# Patient Record
Sex: Female | Born: 1975 | Hispanic: Yes | Smoking: Never smoker
Health system: Southern US, Community
[De-identification: ages and names within clinical notes are randomized; demographics above are authoritative.]

## PROBLEM LIST (undated history)

## (undated) DIAGNOSIS — K219 Gastro-esophageal reflux disease without esophagitis: Secondary | ICD-10-CM

## (undated) DIAGNOSIS — K760 Fatty (change of) liver, not elsewhere classified: Secondary | ICD-10-CM

## (undated) HISTORY — DX: Gastro-esophageal reflux disease without esophagitis: K21.9

## (undated) HISTORY — PX: OTHER SURGICAL HISTORY: SHX169

## (undated) HISTORY — PX: TUBAL LIGATION: SHX77

## (undated) HISTORY — DX: Fatty (change of) liver, not elsewhere classified: K76.0

---

## 2002-03-14 ENCOUNTER — Encounter: Payer: Self-pay | Admitting: General Surgery

## 2002-03-14 ENCOUNTER — Ambulatory Visit (HOSPITAL_COMMUNITY): Admission: RE | Admit: 2002-03-14 | Discharge: 2002-03-14 | Payer: Self-pay | Admitting: General Surgery

## 2003-06-18 ENCOUNTER — Inpatient Hospital Stay (HOSPITAL_COMMUNITY): Admission: AD | Admit: 2003-06-18 | Discharge: 2003-06-20 | Payer: Self-pay | Admitting: Obstetrics and Gynecology

## 2005-01-29 ENCOUNTER — Inpatient Hospital Stay (HOSPITAL_COMMUNITY): Admission: AD | Admit: 2005-01-29 | Discharge: 2005-02-01 | Payer: Self-pay | Admitting: Obstetrics and Gynecology

## 2006-05-18 ENCOUNTER — Inpatient Hospital Stay (HOSPITAL_COMMUNITY): Admission: AD | Admit: 2006-05-18 | Discharge: 2006-05-20 | Payer: Self-pay | Admitting: Obstetrics and Gynecology

## 2008-12-20 HISTORY — PX: HAND SURGERY: SHX662

## 2010-09-27 ENCOUNTER — Emergency Department (HOSPITAL_COMMUNITY): Admission: EM | Admit: 2010-09-27 | Discharge: 2010-09-27 | Payer: Self-pay | Admitting: Internal Medicine

## 2011-02-19 ENCOUNTER — Emergency Department (HOSPITAL_COMMUNITY): Payer: Self-pay

## 2011-02-19 ENCOUNTER — Emergency Department (HOSPITAL_COMMUNITY)
Admission: EM | Admit: 2011-02-19 | Discharge: 2011-02-19 | Disposition: A | Discharge status: Home / Self Care | Payer: Self-pay | Attending: Emergency Medicine | Admitting: Emergency Medicine

## 2011-02-19 DIAGNOSIS — R109 Unspecified abdominal pain: Secondary | ICD-10-CM | POA: Insufficient documentation

## 2011-02-19 DIAGNOSIS — K7689 Other specified diseases of liver: Secondary | ICD-10-CM | POA: Insufficient documentation

## 2011-02-19 LAB — CBC
HCT: 36.3 % (ref 36.0–46.0)
Hemoglobin: 12.3 g/dL (ref 12.0–15.0)
MCH: 28.3 pg (ref 26.0–34.0)
MCHC: 33.9 g/dL (ref 30.0–36.0)
MCV: 83.4 fL (ref 78.0–100.0)
Platelets: 300 10*3/uL (ref 150–400)
RBC: 4.35 MIL/uL (ref 3.87–5.11)
RDW: 13.4 % (ref 11.5–15.5)
WBC: 8.7 10*3/uL (ref 4.0–10.5)

## 2011-02-19 LAB — URINALYSIS, ROUTINE W REFLEX MICROSCOPIC
Bilirubin Urine: NEGATIVE
Glucose, UA: NEGATIVE mg/dL
Hgb urine dipstick: NEGATIVE
Ketones, ur: NEGATIVE mg/dL
Nitrite: NEGATIVE
Protein, ur: NEGATIVE mg/dL
Specific Gravity, Urine: >1.03 — ABNORMAL HIGH (ref 1.005–1.030)
Urobilinogen, UA: 0.2 mg/dL (ref 0.0–1.0)
pH: 5.5 (ref 5.0–8.0)

## 2011-02-19 LAB — COMPREHENSIVE METABOLIC PANEL
BUN: 10 mg/dL (ref 6–23)
Calcium: 8.9 mg/dL (ref 8.4–10.5)
Glucose, Bld: 83 mg/dL (ref 70–99)
Sodium: 137 mEq/L (ref 135–145)
Total Protein: 7.4 g/dL (ref 6.0–8.3)

## 2011-02-19 LAB — DIFFERENTIAL
Basophils Absolute: 0 10*3/uL (ref 0.0–0.1)
Basophils Relative: 0 % (ref 0–1)
Eosinophils Absolute: 0.1 10*3/uL (ref 0.0–0.7)
Eosinophils Relative: 1 % (ref 0–5)
Lymphocytes Relative: 14 % (ref 12–46)
Lymphs Abs: 1.2 10*3/uL (ref 0.7–4.0)
Monocytes Absolute: 0.4 10*3/uL (ref 0.1–1.0)
Monocytes Relative: 4 % (ref 3–12)
Neutro Abs: 7 10*3/uL (ref 1.7–7.7)
Neutrophils Relative %: 80 % — ABNORMAL HIGH (ref 43–77)

## 2011-02-19 LAB — PREGNANCY, URINE: Preg Test, Ur: NEGATIVE

## 2011-03-03 LAB — COMPREHENSIVE METABOLIC PANEL
Alkaline Phosphatase: 61 U/L (ref 39–117)
BUN: 8 mg/dL (ref 6–23)
CO2: 24 mEq/L (ref 19–32)
Chloride: 104 mEq/L (ref 96–112)
Glucose, Bld: 107 mg/dL — ABNORMAL HIGH (ref 70–99)
Potassium: 3.8 mEq/L (ref 3.5–5.1)
Total Bilirubin: 0.6 mg/dL (ref 0.3–1.2)

## 2011-03-03 LAB — URINALYSIS, ROUTINE W REFLEX MICROSCOPIC
Glucose, UA: NEGATIVE mg/dL
Ketones, ur: NEGATIVE mg/dL
Protein, ur: NEGATIVE mg/dL

## 2011-03-03 LAB — CBC
HCT: 37.7 % (ref 36.0–46.0)
MCV: 85.8 fL (ref 78.0–100.0)
RBC: 4.4 MIL/uL (ref 3.87–5.11)
WBC: 8 10*3/uL (ref 4.0–10.5)

## 2011-03-03 LAB — DIFFERENTIAL
Basophils Absolute: 0 10*3/uL (ref 0.0–0.1)
Basophils Relative: 0 % (ref 0–1)
Monocytes Absolute: 0.4 10*3/uL (ref 0.1–1.0)
Neutro Abs: 5.2 10*3/uL (ref 1.7–7.7)
Neutrophils Relative %: 66 % (ref 43–77)

## 2011-03-03 LAB — PREGNANCY, URINE: Preg Test, Ur: NEGATIVE

## 2011-05-07 NOTE — H&P (Signed)
   NAME:  Brianna Costa                         ACCOUNT NO.:  [id number]   MEDICAL RECORD NO.:  [id number]                   PATIENT TYPE:  OIB   LOCATION:  A414                                 FACILITY:  [facility]   PHYSICIAN:  Tilda Burrow, M.D.              DATE OF BIRTH:  [birth year]   DATE OF ADMISSION:  06/18/2003  DATE OF DISCHARGE:                                HISTORY & PHYSICAL   CHIEF COMPLAINT:  Contractions since 0900.   HISTORY OF PRESENT ILLNESS:  Brianna Costa is a 35 year old gravida 3, para 2 with  an EDC of June 22, 2003 based on a first and second trimester ultrasound,  placing her at 39 weeks and 3 days gestation. Prenatal course is  uncomplicated. Total weight gain has been 17 pounds with appropriate fundal  height growth. Blood pressures are 110s to 120s over 60s to 80s.   PRENATAL LABORATORY DATA:  Blood type B+, Rubella immune. HBsAg, HIV, GC,  Chlamydia, GBS and sickle are all negative.   PAST MEDICAL HISTORY:  Noncontributory.   PAST SURGICAL HISTORY:  Benign.   ALLERGIES:  No known drug allergies.   SOCIAL HISTORY:  Married housewife. Is a Jehovah's Witness.   GYNECOLOGY/OBSTETRIC HISTORY:  A vaginal delivery at approximately 36 weeks  in 1997 of a 5-pound, 2-ounce female in [place]. In 2001, a vaginal delivery at  38 weeks of a 7-pound, 12-ounce female at [facility] without problems.   FAMILY HISTORY:  Noncontributory.   PHYSICAL EXAMINATION:  HEENT:  Within normal limits.  HEART:  Regular rate and rhythm.  LUNGS:  Clear.  ABDOMEN:  Soft and nontender. Having moderate contractions about every 5  minutes. Cervix has progressed from 375 minus 1 to 690 and minus 1 over the  course of 1 and 1/2 hours. Fetal heart rate is 120s and 130s, reactive  without decelerations. Artificial rupture of membranes reveals clear fluid.  The patient is tolerating extremely well.  EXTREMITIES:  Legs are negative.    IMPRESSION:  Intrauterine pregnancy  at 67 and 1/[redacted] weeks  gestation, active  labor.   PLAN:  Expectant management.     Jacklyn Shell, C.N.M.          Tilda Burrow, M.D.    FC/MEDQ  D:  06/18/2003  T:  06/18/2003  Job:  [id number]   cc:   [facility]

## 2011-05-07 NOTE — Op Note (Signed)
   NAME:  Brianna Costa                         ACCOUNT NO.:  [id number]   MEDICAL RECORD NO.:  [id number]                   PATIENT TYPE:  INP   LOCATION:  LDR1                                 FACILITY:  [facility]   PHYSICIAN:  Tilda Burrow, M.D.              DATE OF BIRTH:  1976-10-27   DATE OF PROCEDURE:  DATE OF DISCHARGE:                                 OPERATIVE REPORT   DESCRIPTION OF PROCEDURE:  [name] developed an irresistible urge to push  when she was approximately 8 cm dilated and the cervix was reducible so she  was allowed to push. After a very brief second stage, she delivered a viable  female infant at 19:49. The mouth and nose were suctioned with a bulb syringe,  a nuchal cord was easily reduced and the body delivered without difficulty.  Weight of 7 pounds, 6 ounces, Apgar 9 & 9. The cord was doubly clamped and  cut by the father of the baby. The placenta separated spontaneously and was  delivered via controlled cord traction at 19:55. It was inspected and  appears to be intact with a three vessel cord. The fundus was massaged to  firm with little blood loss noted. 10 units of Pitocin was given IM  injection. The vagina was inspected and is intact. Estimated blood loss 300  mL.     Zenovia Jordan, P.A.                      Tilda Burrow, M.D.    RRK/MEDQ  D:  06/18/2003  T:  06/18/2003  Job:  [id number]

## 2011-05-21 ENCOUNTER — Emergency Department (HOSPITAL_COMMUNITY)
Admission: EM | Admit: 2011-05-21 | Discharge: 2011-05-21 | Disposition: A | Discharge status: Home / Self Care | Payer: No Typology Code available for payment source | Attending: Emergency Medicine | Admitting: Emergency Medicine

## 2011-05-21 ENCOUNTER — Emergency Department (HOSPITAL_COMMUNITY): Payer: No Typology Code available for payment source

## 2011-05-21 DIAGNOSIS — T148XXA Other injury of unspecified body region, initial encounter: Secondary | ICD-10-CM | POA: Insufficient documentation

## 2011-05-21 DIAGNOSIS — R7989 Other specified abnormal findings of blood chemistry: Secondary | ICD-10-CM | POA: Insufficient documentation

## 2011-05-21 DIAGNOSIS — Y998 Other external cause status: Secondary | ICD-10-CM | POA: Insufficient documentation

## 2011-05-21 DIAGNOSIS — M542 Cervicalgia: Secondary | ICD-10-CM | POA: Insufficient documentation

## 2011-05-21 DIAGNOSIS — R112 Nausea with vomiting, unspecified: Secondary | ICD-10-CM | POA: Insufficient documentation

## 2011-05-21 DIAGNOSIS — Y92410 Unspecified street and highway as the place of occurrence of the external cause: Secondary | ICD-10-CM | POA: Insufficient documentation

## 2011-05-21 DIAGNOSIS — M549 Dorsalgia, unspecified: Secondary | ICD-10-CM | POA: Insufficient documentation

## 2011-05-21 DIAGNOSIS — S301XXA Contusion of abdominal wall, initial encounter: Secondary | ICD-10-CM | POA: Insufficient documentation

## 2011-05-21 DIAGNOSIS — Z79899 Other long term (current) drug therapy: Secondary | ICD-10-CM | POA: Insufficient documentation

## 2011-05-21 DIAGNOSIS — K7689 Other specified diseases of liver: Secondary | ICD-10-CM | POA: Insufficient documentation

## 2011-05-21 DIAGNOSIS — R52 Pain, unspecified: Secondary | ICD-10-CM | POA: Insufficient documentation

## 2011-05-21 LAB — DIFFERENTIAL
Lymphocytes Relative: 24 % (ref 12–46)
Lymphs Abs: 2.2 10*3/uL (ref 0.7–4.0)
Monocytes Absolute: 0.5 10*3/uL (ref 0.1–1.0)
Monocytes Relative: 6 % (ref 3–12)
Neutro Abs: 6.2 10*3/uL (ref 1.7–7.7)

## 2011-05-21 LAB — CBC
HCT: 36.3 % (ref 36.0–46.0)
Hemoglobin: 12.2 g/dL (ref 12.0–15.0)
MCH: 28.4 pg (ref 26.0–34.0)
MCHC: 33.6 g/dL (ref 30.0–36.0)
MCV: 84.4 fL (ref 78.0–100.0)

## 2011-05-21 LAB — COMPREHENSIVE METABOLIC PANEL
CO2: 23 mEq/L (ref 19–32)
Calcium: 9.9 mg/dL (ref 8.4–10.5)
Creatinine, Ser: 0.53 mg/dL (ref 0.4–1.2)
GFR calc non Af Amer: >60 mL/min (ref >60)
Glucose, Bld: 90 mg/dL (ref 70–99)
Total Bilirubin: 0.7 mg/dL (ref 0.3–1.2)

## 2011-05-21 LAB — URINALYSIS, ROUTINE W REFLEX MICROSCOPIC
Bilirubin Urine: NEGATIVE
Hgb urine dipstick: NEGATIVE
Specific Gravity, Urine: >1.03 — ABNORMAL HIGH (ref 1.005–1.030)
Urobilinogen, UA: 1 mg/dL (ref 0.0–1.0)

## 2011-05-27 ENCOUNTER — Ambulatory Visit (INDEPENDENT_AMBULATORY_CARE_PROVIDER_SITE_OTHER): Payer: No Typology Code available for payment source | Admitting: Gastroenterology

## 2011-05-27 ENCOUNTER — Ambulatory Visit (HOSPITAL_COMMUNITY)
Admission: RE | Admit: 2011-05-27 | Discharge: 2011-05-27 | Disposition: A | Discharge status: Home / Self Care | Payer: No Typology Code available for payment source | Source: Ambulatory Visit | Attending: Gastroenterology | Admitting: Gastroenterology

## 2011-05-27 ENCOUNTER — Encounter: Payer: Self-pay | Admitting: Gastroenterology

## 2011-05-27 DIAGNOSIS — R112 Nausea with vomiting, unspecified: Secondary | ICD-10-CM

## 2011-05-27 DIAGNOSIS — R109 Unspecified abdominal pain: Secondary | ICD-10-CM | POA: Insufficient documentation

## 2011-05-27 DIAGNOSIS — R1084 Generalized abdominal pain: Secondary | ICD-10-CM

## 2011-05-27 LAB — CBC WITH DIFFERENTIAL/PLATELET
Basophils Absolute: 0 K/uL (ref 0.0–0.1)
Basophils Relative: 0 % (ref 0–1)
Eosinophils Absolute: 0.1 K/uL (ref 0.0–0.7)
Eosinophils Relative: 2 % (ref 0–5)
HCT: 36.9 % (ref 36.0–46.0)
Hemoglobin: 12.6 g/dL (ref 12.0–15.0)
Lymphocytes Relative: 28 % (ref 12–46)
Lymphs Abs: 2 K/uL (ref 0.7–4.0)
MCH: 28.9 pg (ref 26.0–34.0)
MCHC: 34.1 g/dL (ref 30.0–36.0)
MCV: 84.6 fL (ref 78.0–100.0)
Monocytes Absolute: 0.3 K/uL (ref 0.1–1.0)
Monocytes Relative: 5 % (ref 3–12)
Neutro Abs: 4.5 K/uL (ref 1.7–7.7)
Neutrophils Relative %: 63 % (ref 43–77)
Platelets: 328 K/uL (ref 150–400)
RBC: 4.36 MIL/uL (ref 3.87–5.11)
RDW: 13.5 % (ref 11.5–15.5)
WBC: 7.1 K/uL (ref 4.0–10.5)

## 2011-05-27 LAB — HEPATIC FUNCTION PANEL
ALT: 60 U/L — ABNORMAL HIGH (ref 0–35)
AST: 48 U/L — ABNORMAL HIGH (ref 0–37)
Albumin: 4 g/dL (ref 3.5–5.2)
Alkaline Phosphatase: 81 U/L (ref 39–117)
Bilirubin, Direct: 0.1 mg/dL (ref 0.0–0.3)
Indirect Bilirubin: 0.6 mg/dL (ref 0.0–0.9)
Total Bilirubin: 0.7 mg/dL (ref 0.3–1.2)
Total Protein: 7.8 g/dL (ref 6.0–8.3)

## 2011-05-27 LAB — LIPASE: Lipase: 24 U/L (ref 11–59)

## 2011-05-27 NOTE — Patient Instructions (Signed)
Please go to the radiology department and have xray done immediately.  Have labs drawn as well.  Continue to take Zofran as needed for nausea.  Only clear liquids.  If you have increased pain, bloating, inability to tolerate liquids, go back to the emergency room.  We will be reviewing the results and contacting you as soon as available.
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
## 2011-05-28 DIAGNOSIS — R1084 Generalized abdominal pain: Secondary | ICD-10-CM | POA: Insufficient documentation

## 2011-05-28 DIAGNOSIS — R112 Nausea with vomiting, unspecified: Secondary | ICD-10-CM | POA: Insufficient documentation
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
## 2011-05-28 NOTE — Progress Notes (Signed)
Cc to PCP 

## 2011-06-07 ENCOUNTER — Encounter: Payer: Self-pay | Admitting: Internal Medicine

## 2011-06-07 NOTE — Progress Notes (Signed)
Pt is scheduled for EGD on 07/11- Instructions mailed

## 2011-06-07 NOTE — Progress Notes (Signed)
Pt received letter, CG informed of results and scheduled procedure. Prilosec called to [employer] [facility].

## 2011-06-10 ENCOUNTER — Ambulatory Visit: Payer: No Typology Code available for payment source | Admitting: Urgent Care

## 2011-06-14 ENCOUNTER — Ambulatory Visit: Payer: No Typology Code available for payment source | Admitting: Gastroenterology

## 2011-06-17 ENCOUNTER — Encounter: Payer: Self-pay | Admitting: Gastroenterology

## 2011-06-17 ENCOUNTER — Other Ambulatory Visit: Payer: Self-pay | Admitting: Gastroenterology

## 2011-06-17 ENCOUNTER — Ambulatory Visit (INDEPENDENT_AMBULATORY_CARE_PROVIDER_SITE_OTHER): Payer: No Typology Code available for payment source | Admitting: Gastroenterology

## 2011-06-17 DIAGNOSIS — R112 Nausea with vomiting, unspecified: Secondary | ICD-10-CM

## 2011-06-17 DIAGNOSIS — R1084 Generalized abdominal pain: Secondary | ICD-10-CM

## 2011-06-17 DIAGNOSIS — K5900 Constipation, unspecified: Secondary | ICD-10-CM

## 2011-06-17 LAB — TSH: TSH: 1.804 u[IU]/mL (ref 0.350–4.500)

## 2011-06-17 LAB — POC HEMOCCULT BLD/STL (OFFICE/1-CARD/DIAGNOSTIC): Fecal Occult Blood, POC: NEGATIVE

## 2011-06-17 MED ORDER — POLYETHYLENE GLYCOL 3350 GRAN: 17.0000 g | GRANULES | Freq: Every day | Status: DC | PRN

## 2011-06-17 MED ORDER — MAGNESIUM CITRATE PO SOLN: 296.0000 mL | Freq: Once | ORAL | Status: AC

## 2011-06-17 NOTE — Assessment & Plan Note (Addendum)
Constipation/obstipation without worrisome features. No fecal impaction on DRE. Abdominal exam benign. Start bowel regimen. If develops n/v, worsening abd pain, unable to have BM she should call us.   Check thryoid status.
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
## 2011-06-17 NOTE — Assessment & Plan Note (Signed)
Persisting nausea. Scheduled for EGD. Abdominal pain, stable.

## 2011-06-17 NOTE — Progress Notes (Signed)
Addended by: Tiffany Kocher on: 06/17/2011 01:28 PM   Modules accepted: Orders

## 2011-06-17 NOTE — Progress Notes (Signed)
Cc to PCP 

## 2011-06-17 NOTE — Patient Instructions (Addendum)
Constipacin (Constipation) La constipacin tiene diferentes causas. Entre stas se incluyen:  Dieta deficiente.  Inactividad.   Deshidratacin.   Pldoras para orinar (diurticos).   Diabetes.   Estrs emocional.  Algunos medicamentos (en especial narcticos).   Tumores o enfermedades en los intestinos.   Anticidos que contengan aluminio.   Ictus.   Enfermedad de Parkinson   Hoy usted no necesita ningn [place]. Necesitar una evaluacin ms profunda para ayudar a Veterinary surgeon causa del problema. INSTRUCCIONES PARA EL CUIDADO DOMICILIARIO  La mejor forma de controlar la constipacin es aumentar la cantidad de fibra en la dieta y comer ms frutas y verduras.   Aumente lentamente el consumo de fibras a 25 a 38 gramos por da. Granos integrales, frutas, verduras y legumbres son buenas fuentes de [place]. Un nutricionista matriculado podr ayudarlo a incorporar alimentos Photographer en su dieta.   Beba al menos 8 tazas de lquido al comer alimentos altos en fibra para evitar futuras constipaciones.   Otras medidas incluyen:   Aumente el consumo de lquidos por va oral (10 a 12 vasos de Warehouse manager).   Realice actividad fsica con regularidad.   Vaya al bao cuando sienta necesidad, no espere.   Los supositorios, segn lo haya indicado el mdico, le ayudarn a Scientist, research (life sciences) colon para que se vace.   No trate de controlar constipacin con laxantes. El problema podra empeorar. Esto se debe a que si se toman laxantes durante un largo periodo de [place], pueden ocasionar que los msculos del colon se debiliten.   Si se le ha hecho un enema hoy, es slo una medida temporaria. No debera basarse en l para un tratamiento de constipacin de largo plazo (crnica). Si se utilizan enemas a Air cabin crew, tambin podran [employer] del colon.   De ser posible, se debern evitar medidas ms fuertes como el sulfato de magnesio. Esto puede producirle diarrea  incontrolable. La utilizacin del sulfato de magnesio podra no darle el tiempo necesario para llegar al bao.  SOLICITE ATENCIN MDICA DE INMEDIATO SI PRESENTA:  Dolor abdominal o de cintura intenso.   Presenta vmitos repetidas veces o est deshidratado.   Observa sangre en la materia fecal, siente fiebre, escalofros o se desmaya.   Observa sangre de color rojo brillante en las heces.  EST SEGURO QUE:   Comprende las instrucciones para el alta mdica.   Controlar su enfermedad.   Solicitar atencin mdica de inmediato segn las indicaciones.  Document Released: 12/06/2005 Document Re-Released: 05/26/2010 [facility] Patient Information 2011 [place], [place].

## 2011-06-18 NOTE — Progress Notes (Signed)
WOULD ADD AMITIZA.

## 2011-06-30 ENCOUNTER — Ambulatory Visit (HOSPITAL_COMMUNITY)
Admission: RE | Admit: 2011-06-30 | Discharge: 2011-06-30 | Disposition: A | Discharge status: Home / Self Care | Payer: No Typology Code available for payment source | Source: Ambulatory Visit | Attending: Internal Medicine | Admitting: Internal Medicine

## 2011-06-30 ENCOUNTER — Encounter (HOSPITAL_COMMUNITY): Payer: Self-pay | Admitting: *Deleted

## 2011-06-30 ENCOUNTER — Encounter (HOSPITAL_COMMUNITY)
Admission: RE | Disposition: A | Discharge status: Home / Self Care | Payer: Self-pay | Source: Ambulatory Visit | Attending: Internal Medicine

## 2011-06-30 ENCOUNTER — Ambulatory Visit: Admit: 2011-06-30 | Payer: Self-pay | Admitting: Internal Medicine

## 2011-06-30 ENCOUNTER — Encounter: Payer: No Typology Code available for payment source | Admitting: Internal Medicine

## 2011-06-30 DIAGNOSIS — K449 Diaphragmatic hernia without obstruction or gangrene: Secondary | ICD-10-CM

## 2011-06-30 DIAGNOSIS — R109 Unspecified abdominal pain: Secondary | ICD-10-CM

## 2011-06-30 DIAGNOSIS — K5909 Other constipation: Secondary | ICD-10-CM | POA: Insufficient documentation

## 2011-06-30 DIAGNOSIS — R112 Nausea with vomiting, unspecified: Secondary | ICD-10-CM

## 2011-06-30 HISTORY — PX: ESOPHAGOGASTRODUODENOSCOPY: SHX5428

## 2011-06-30 SURGERY — EGD (ESOPHAGOGASTRODUODENOSCOPY)

## 2011-06-30 SURGERY — EGD (ESOPHAGOGASTRODUODENOSCOPY)
Anesthesia: Moderate Sedation

## 2011-06-30 MED ORDER — MIDAZOLAM HCL 5 MG/5ML IJ SOLN
INTRAMUSCULAR | Status: AC
  Filled 2011-06-30: qty 10

## 2011-06-30 MED ORDER — BUTAMBEN-TETRACAINE-BENZOCAINE 2-2-14 % EX AERO
INHALATION_SPRAY | CUTANEOUS | Status: DC | PRN
  Administered 2011-06-30: 2 via TOPICAL

## 2011-06-30 MED ORDER — SODIUM CHLORIDE 0.45 % IV SOLN: Freq: Once | INTRAVENOUS | Status: DC

## 2011-06-30 MED ORDER — MEPERIDINE HCL 100 MG/ML IJ SOLN
INTRAMUSCULAR | Status: AC
  Filled 2011-06-30: qty 2

## 2011-06-30 MED ORDER — MEPERIDINE HCL 25 MG/ML IJ SOLN
INTRAMUSCULAR | Status: DC | PRN
  Administered 2011-06-30: 25 mg via INTRAVENOUS
  Administered 2011-06-30: 50 mg via INTRAVENOUS

## 2011-06-30 MED ORDER — MIDAZOLAM HCL 5 MG/5ML IJ SOLN
INTRAMUSCULAR | Status: DC | PRN
  Administered 2011-06-30: 2 mg via INTRAVENOUS
  Administered 2011-06-30: 1 mg via INTRAVENOUS

## 2011-06-30 MED ORDER — MEPERIDINE HCL 100 MG/ML IJ SOLN
INTRAMUSCULAR | Status: AC
  Filled 2011-06-30: qty 1

## 2011-07-12 ENCOUNTER — Encounter (HOSPITAL_COMMUNITY): Payer: Self-pay | Admitting: Internal Medicine

## 2011-07-26 ENCOUNTER — Other Ambulatory Visit: Payer: Self-pay | Admitting: Gastroenterology

## 2011-07-27 LAB — CBC WITH DIFFERENTIAL/PLATELET
Basophils Relative: 1 % (ref 0–1)
Eosinophils Absolute: 0.1 10*3/uL (ref 0.0–0.7)
HCT: 40.9 % (ref 36.0–46.0)
Hemoglobin: 12.8 g/dL (ref 12.0–15.0)
Lymphs Abs: 2.4 10*3/uL (ref 0.7–4.0)
MCH: 27.9 pg (ref 26.0–34.0)
MCHC: 31.3 g/dL (ref 30.0–36.0)
MCV: 89.3 fL (ref 78.0–100.0)
Monocytes Absolute: 0.4 10*3/uL (ref 0.1–1.0)
Monocytes Relative: 6 % (ref 3–12)

## 2011-07-27 LAB — HEPATIC FUNCTION PANEL
ALT: 46 U/L — ABNORMAL HIGH (ref 0–35)
AST: 30 U/L (ref 0–37)
Albumin: 4 g/dL (ref 3.5–5.2)
Alkaline Phosphatase: 59 U/L (ref 39–117)

## 2011-07-27 LAB — LIPASE: Lipase: 13 U/L (ref 0–75)

## 2011-08-05 NOTE — Progress Notes (Signed)
Quick Note:  Looking better, AST/ALT trending down. AST actually normalized. ALT continues to decline. Just needs appt in a few weeks for follow-up after EGD if not already planned. Will repeat LFTs in 3 months. ______

## 2011-08-06 NOTE — Progress Notes (Signed)
Quick Note:  Pt aware, reminded her about appt 08/11/11 with LSL. Lab order on file ______

## 2011-08-09 ENCOUNTER — Other Ambulatory Visit: Payer: Self-pay | Admitting: Gastroenterology

## 2011-08-09 DIAGNOSIS — R945 Abnormal results of liver function studies: Secondary | ICD-10-CM

## 2011-08-11 ENCOUNTER — Telehealth: Payer: Self-pay | Admitting: Gastroenterology

## 2011-08-11 ENCOUNTER — Ambulatory Visit: Payer: No Typology Code available for payment source | Admitting: Gastroenterology

## 2011-08-11 NOTE — Telephone Encounter (Signed)
Noted  
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
## 2014-01-17 ENCOUNTER — Other Ambulatory Visit: Payer: Self-pay | Admitting: Family Medicine

## 2015-12-08 ENCOUNTER — Encounter: Payer: Self-pay | Admitting: Gastroenterology

## 2016-01-08 ENCOUNTER — Telehealth: Payer: Self-pay | Admitting: Nurse Practitioner

## 2016-01-08 ENCOUNTER — Ambulatory Visit: Payer: Self-pay | Admitting: Nurse Practitioner

## 2016-01-08 NOTE — Telephone Encounter (Signed)
Pt was a no show

## 2016-01-12 NOTE — Telephone Encounter (Signed)
Noted  

## 2025-01-02 ENCOUNTER — Emergency Department (HOSPITAL_COMMUNITY)
Admission: EM | Admit: 2025-01-02 | Discharge: 2025-01-02 | Disposition: A | Discharge status: Home / Self Care | Payer: Self-pay | Attending: Emergency Medicine | Admitting: Emergency Medicine

## 2025-01-02 ENCOUNTER — Encounter (HOSPITAL_COMMUNITY): Payer: Self-pay

## 2025-01-02 ENCOUNTER — Emergency Department (HOSPITAL_COMMUNITY): Payer: Self-pay

## 2025-01-02 ENCOUNTER — Encounter: Payer: Self-pay | Admitting: Emergency Medicine

## 2025-01-02 ENCOUNTER — Other Ambulatory Visit: Payer: Self-pay

## 2025-01-02 ENCOUNTER — Ambulatory Visit
Admission: EM | Admit: 2025-01-02 | Discharge: 2025-01-02 | Disposition: A | Discharge status: Home / Self Care | Payer: Self-pay | Attending: Family Medicine | Admitting: Family Medicine

## 2025-01-02 DIAGNOSIS — R739 Hyperglycemia, unspecified: Secondary | ICD-10-CM

## 2025-01-02 DIAGNOSIS — N858 Other specified noninflammatory disorders of uterus: Secondary | ICD-10-CM | POA: Insufficient documentation

## 2025-01-02 DIAGNOSIS — E119 Type 2 diabetes mellitus without complications: Secondary | ICD-10-CM

## 2025-01-02 DIAGNOSIS — Z79899 Other long term (current) drug therapy: Secondary | ICD-10-CM | POA: Insufficient documentation

## 2025-01-02 DIAGNOSIS — N83202 Unspecified ovarian cyst, left side: Secondary | ICD-10-CM | POA: Insufficient documentation

## 2025-01-02 DIAGNOSIS — R1030 Lower abdominal pain, unspecified: Secondary | ICD-10-CM

## 2025-01-02 HISTORY — DX: Type 2 diabetes mellitus without complications: E11.9

## 2025-01-02 LAB — POCT URINE DIPSTICK
Glucose, UA: 500 mg/dL — AB
Leukocytes, UA: NEGATIVE
Nitrite, UA: NEGATIVE
POC PROTEIN,UA: 30 — AB
Spec Grav, UA: 1.025
Urobilinogen, UA: 0.2 U/dL
pH, UA: 5.5

## 2025-01-02 LAB — CBC WITH DIFFERENTIAL/PLATELET
Abs Immature Granulocytes: 0.02 K/uL (ref 0.00–0.07)
Basophils Absolute: 0.1 K/uL (ref 0.0–0.1)
Basophils Relative: 1 %
Eosinophils Absolute: 0 K/uL (ref 0.0–0.5)
Eosinophils Relative: 0 %
HCT: 40.9 % (ref 36.0–46.0)
Hemoglobin: 13.7 g/dL (ref 12.0–15.0)
Immature Granulocytes: 0 %
Lymphocytes Relative: 18 %
Lymphs Abs: 1.5 K/uL (ref 0.7–4.0)
MCH: 26.7 pg (ref 26.0–34.0)
MCHC: 33.5 g/dL (ref 30.0–36.0)
MCV: 79.7 fL — ABNORMAL LOW (ref 80.0–100.0)
Monocytes Absolute: 0.9 K/uL (ref 0.1–1.0)
Monocytes Relative: 11 %
Neutro Abs: 5.6 K/uL (ref 1.7–7.7)
Neutrophils Relative %: 70 %
Platelets: 298 K/uL (ref 150–400)
RBC: 5.13 MIL/uL — ABNORMAL HIGH (ref 3.87–5.11)
RDW: 14.6 % (ref 11.5–15.5)
WBC: 8 K/uL (ref 4.0–10.5)
nRBC: 0 % (ref 0.0–0.2)

## 2025-01-02 LAB — URINALYSIS, ROUTINE W REFLEX MICROSCOPIC
Bacteria, UA: NONE SEEN
Bilirubin Urine: NEGATIVE
Glucose, UA: >=500 mg/dL — AB
Hgb urine dipstick: NEGATIVE
Ketones, ur: 80 mg/dL — AB
Leukocytes,Ua: NEGATIVE
Nitrite: NEGATIVE
Protein, ur: 100 mg/dL — AB
Specific Gravity, Urine: >1.046 — ABNORMAL HIGH (ref 1.005–1.030)
pH: 5 (ref 5.0–8.0)

## 2025-01-02 LAB — COMPREHENSIVE METABOLIC PANEL WITH GFR
ALT: 20 U/L (ref 0–44)
AST: 20 U/L (ref 15–41)
Albumin: 4 g/dL (ref 3.5–5.0)
Alkaline Phosphatase: 141 U/L — ABNORMAL HIGH (ref 38–126)
Anion gap: 16 — ABNORMAL HIGH (ref 5–15)
BUN: 11 mg/dL (ref 6–20)
CO2: 21 mmol/L — ABNORMAL LOW (ref 22–32)
Calcium: 9.4 mg/dL (ref 8.9–10.3)
Chloride: 98 mmol/L (ref 98–111)
Creatinine, Ser: 0.47 mg/dL (ref 0.44–1.00)
GFR, Estimated: >60 mL/min
Glucose, Bld: 270 mg/dL — ABNORMAL HIGH (ref 70–99)
Potassium: 3.6 mmol/L (ref 3.5–5.1)
Sodium: 134 mmol/L — ABNORMAL LOW (ref 135–145)
Total Bilirubin: 0.8 mg/dL (ref 0.0–1.2)
Total Protein: 8.2 g/dL — ABNORMAL HIGH (ref 6.5–8.1)

## 2025-01-02 LAB — LIPASE, BLOOD: Lipase: 19 U/L (ref 11–51)

## 2025-01-02 LAB — HCG, SERUM, QUALITATIVE: Preg, Serum: NEGATIVE

## 2025-01-02 LAB — POCT URINE PREGNANCY: Preg Test, Ur: NEGATIVE

## 2025-01-02 LAB — CBG MONITORING, ED: Glucose-Capillary: 236 mg/dL — ABNORMAL HIGH (ref 70–99)

## 2025-01-02 MED ORDER — NAPROXEN 500 MG PO TABS: 500.0000 mg | ORAL_TABLET | Freq: Two times a day (BID) | ORAL | 0 refills | Status: DC

## 2025-01-02 MED ORDER — ONDANSETRON HCL 4 MG/2ML IJ SOLN
4.0000 mg | Freq: Once | INTRAMUSCULAR | Status: AC
  Administered 2025-01-02: 4 mg via INTRAVENOUS
  Filled 2025-01-02: qty 2

## 2025-01-02 MED ORDER — MEGESTROL ACETATE 40 MG PO TABS: 120.0000 mg | ORAL_TABLET | Freq: Every day | ORAL | 0 refills | Status: AC

## 2025-01-02 MED ORDER — ONDANSETRON 4 MG PO TBDP: 4.0000 mg | ORAL_TABLET | Freq: Three times a day (TID) | ORAL | 0 refills | Status: DC | PRN

## 2025-01-02 MED ORDER — METFORMIN HCL 500 MG PO TABS: 500.0000 mg | ORAL_TABLET | Freq: Every day | ORAL | 1 refills | Status: AC

## 2025-01-02 MED ORDER — KETOROLAC TROMETHAMINE 15 MG/ML IJ SOLN
15.0000 mg | Freq: Once | INTRAMUSCULAR | Status: AC
  Administered 2025-01-02: 15 mg via INTRAVENOUS
  Filled 2025-01-02: qty 1

## 2025-01-02 MED ORDER — SODIUM CHLORIDE 0.9 % IV BOLUS
1000.0000 mL | Freq: Once | INTRAVENOUS | Status: AC
  Administered 2025-01-02: 1000 mL via INTRAVENOUS

## 2025-01-02 MED ORDER — MEGESTROL ACETATE 40 MG PO TABS
120.0000 mg | ORAL_TABLET | Freq: Every day | ORAL | Status: DC
  Administered 2025-01-02: 120 mg via ORAL
  Filled 2025-01-02: qty 3

## 2025-01-02 MED ORDER — MORPHINE SULFATE (PF) 4 MG/ML IV SOLN
4.0000 mg | Freq: Once | INTRAVENOUS | Status: AC
  Administered 2025-01-02: 4 mg via INTRAVENOUS
  Filled 2025-01-02: qty 1

## 2025-01-02 NOTE — ED Triage Notes (Signed)
 Pt arrived via POV c/o bilateral abdominal pain. Pt reports Hx of cysts on her ovaries. Pt arrived via POV from Urgent Care for further evaluation.  Spanish interpreter utilized for Triage. Pt also reports possible fever recently and pain began on Jan 9th when she began her menstrual cycle.

## 2025-01-02 NOTE — ED Triage Notes (Signed)
 Period since January 9th.  Has been feeling contraction type pain and fever  has been taking Advil.  States pain medication is not working anymore to control the pain.

## 2025-01-02 NOTE — Discharge Instructions (Signed)
 We recommend you go to the emergency department for further evaluation of your severe abdominal pain.

## 2025-01-02 NOTE — Discharge Instructions (Addendum)
 Por favor, llame para programar una cita con la Dra. Eure del departamento de ginecologa para un examen y evaluacin ms exhaustivos de su quiste ovrico y masa uterina. Tome todos los medicamentos recetados segn las indicaciones. Comience a tomar Metformina, ya que me preocupa que est desarrollando diabetes. Llame para programar una cita con un mdico de atencin primaria para el seguimiento de este problema. Todos los nmeros de telfono se geophysical data processor documento. Regrese a la sala de emergencias de inmediato si presenta algn sntoma nuevo o si sus sntomas empeoran.  [facility] Primary Care Doctor List    Rollene Pesa, MD. Specialty: [facility] Medicine Contact information: [address]  [place] [address]  [phone]   Glendia Fielding, MD. Specialty: [facility] Medicine Contact information: [address]  [place] [address]  [phone]   Benita Outhouse, MD Specialty: Internal Medicine Contact information: [address] [place] [address]  [phone]   Darlyn Hurst, MD. Specialty: Internal Medicine Contact information: [address]  [place] [address]  [phone]    [facility] (Dr. Luke) Specialty: Family Medicine Contact information: [address]  [place] [address]  [phone]   Garnette Lolling, MD. Specialty: [facility] Medicine Contact information: [address]  [address]  [place] [address]  [phone]   Gaither Langton, MD. Specialty: Internal Medicine Contact information: [address]  [address]  [place] [address]  [phone]   [facility] Family Medicine: [address] [phone]  [place], Family medicine [address]  [phone]  [facility] [address] [place], [place] [phone]  [facility]: [address] [phone]    [facility] Bernardino  [address] [place], [place]  [address] [phone]  Services [facility] offers a variety of basic health services.  Services include but are not limited to: Blood pressure checks  Heart rate checks  Blood sugar checks  Urine analysis  Rapid strep tests  Pregnancy tests.  Health education and referrals  People needing more complex services will be directed to a physician online. Using these virtual visits, doctors can evaluate and prescribe medicine and treatments. There will be no medication on-site, though [facility] will help patients fill their prescriptions at little to no cost.   For More information please go to: [email or web address]  Allergy and Asthma:    [address] [place] [phone]  Urology:  [address]  [place] [phone]  [facility]  [address] [place], [place] [phone]  Orthopedics   [address] [place], [place] [phone]  Endocrinology  [address] [place], [place] [phone]  Podiatry: [facility] [phone]

## 2025-01-02 NOTE — ED Notes (Signed)
 Attempted to give meds and introduce pt. Pt in Ultrasound at this time.

## 2025-01-02 NOTE — ED Notes (Signed)
 Patient is being discharged from the Urgent Care and sent to the Emergency Department via private vehicle . Per PA, patient is in need of higher level of care due to ABD pain. Patient is aware and verbalizes understanding of plan of care.  Vitals:   01/02/25 0845  BP: (!) 144/84  Pulse: 85  Resp: 16  Temp: 98.1 F (36.7 C)  SpO2: 95%

## 2025-01-02 NOTE — ED Provider Notes (Signed)
 " [facility]    CSN: [id number] Arrival date & time: 01/02/25  0830      History   Chief Complaint No chief complaint on file.   HPI Brianna Costa is a 49 y.o. female.   Medical interpreter utilized today to facilitate visit with patient's consent.  Patient is presenting today with progressively worsening 10 out of 10 diffuse lower abdominal pain.  She states the pain came on at the start of her period on 12/28/2024 and has been unrelenting since.  Does tend to resolve temporarily with Advil but comes right back when the medication wears off.  Denies associated nausea, vomiting, diarrhea, constipation, urinary symptoms, vaginal symptoms, back pain, concerns for STIs or pregnancy.  Denies past history of similar or other menstrual issues.  Has a history of GERD and fatty liver disease, otherwise no known chronic GI issues.    Past Medical History:  Diagnosis Date   Fatty liver    GERD (gastroesophageal reflux disease)     Patient Active Problem List   Diagnosis Date Noted   Constipation 06/17/2011   Abdominal pain, acute, generalized 05/28/2011   Nausea and vomiting 05/28/2011    Past Surgical History:  Procedure Laterality Date   ESOPHAGOGASTRODUODENOSCOPY  06/30/2011   Procedure: ESOPHAGOGASTRODUODENOSCOPY (EGD);  Surgeon: Lamar CHRISTELLA Hollingshead, MD;  Location: [facility];  Service: Endoscopy;  Laterality: N/A;   None      OB History   No obstetric history on file.      Home Medications    Prior to Admission medications  Medication Sig Start Date End Date Taking? Authorizing Provider  ibuprofen (ADVIL,MOTRIN) 800 MG tablet Take 800 mg by mouth every 8 (eight) hours as needed.      [provider]    Family History Family History  Problem Relation Age of Onset   Colon cancer Neg Hx     Social History Social History[1]   Allergies   Iohexol   Review of Systems Review of Systems Per HPI  Physical Exam Triage Vital Signs ED  Triage Vitals  Encounter Vitals Group     BP 01/02/25 0845 (!) 144/84     Girls Systolic BP Percentile --      Girls Diastolic BP Percentile --      Boys Systolic BP Percentile --      Boys Diastolic BP Percentile --      Pulse Rate 01/02/25 0845 85     Resp 01/02/25 0845 16     Temp 01/02/25 0845 98.1 F (36.7 C)     Temp Source 01/02/25 0845 Oral     SpO2 01/02/25 0845 95 %     Weight --      Height --      Head Circumference --      Peak Flow --      Pain Score 01/02/25 0847 10     Pain Loc --      Pain Education --      Exclude from Growth Chart --    No data found.  Updated Vital Signs BP (!) 144/84 (BP Location: Right Arm)   Pulse 85   Temp 98.1 F (36.7 C) (Oral)   Resp 16   LMP 12/28/2024 (Exact Date)   SpO2 95%   Visual Acuity Right Eye Distance:   Left Eye Distance:   Bilateral Distance:    Right Eye Near:   Left Eye Near:    Bilateral Near:     Physical  Exam Vitals and nursing note reviewed.  Constitutional:      Appearance: Normal appearance. She is not ill-appearing.  HENT:     Head: Atraumatic.     Mouth/Throat:     Mouth: Mucous membranes are moist.     Pharynx: Oropharynx is clear.  Eyes:     Extraocular Movements: Extraocular movements intact.     Conjunctiva/sclera: Conjunctivae normal.  Cardiovascular:     Rate and Rhythm: Normal rate and regular rhythm.     Heart sounds: Normal heart sounds.  Pulmonary:     Effort: Pulmonary effort is normal.     Breath sounds: Normal breath sounds.  Abdominal:     General: Bowel sounds are normal. There is no distension.     Palpations: Abdomen is soft. There is no mass.     Tenderness: There is abdominal tenderness. There is guarding. There is no right CVA tenderness, left CVA tenderness or rebound.     Comments: Guarding on palpation to lower abdomen diffusely.  Negative point tenderness, negative McBurney's and Rovsing's  Musculoskeletal:        General: Normal range of motion.     Cervical  back: Normal range of motion and neck supple.  Skin:    General: Skin is warm and dry.  Neurological:     Mental Status: She is alert and oriented to person, place, and time.  Psychiatric:        Mood and Affect: Mood normal.        Thought Content: Thought content normal.        Judgment: Judgment normal.      UC Treatments / Results  Labs (all labs ordered are listed, but only abnormal results are displayed) Labs Reviewed  POCT URINE DIPSTICK - Abnormal; Notable for the following components:      Result Value   Color, UA straw (*)    Glucose, UA =500 (*)    Bilirubin, UA small (*)    Ketones, POC UA large (80) (*)    Blood, UA trace-intact (*)    POC PROTEIN,UA =30 (*)    All other components within normal limits  POCT URINE PREGNANCY    EKG   Radiology No results found.  Procedures Procedures (including critical care time)  Medications Ordered in UC Medications - No data to display  Initial Impression / Assessment and Plan / UC Course  I have reviewed the triage vital signs and the nursing notes.  Pertinent labs & imaging results that were available during my care of the patient were reviewed by me and considered in my medical decision making (see chart for details).     Given patient's description of 10 out of 10 unrelenting abdominal pain and no obvious cause such as urinary tract infection able to be determined in the setting.  Did recommend further evaluation in the emergency department where she could obtain immediate evaluation with labs, imaging as deemed necessary.  She is agreeable to this plan and wishes to go via private vehicle.  She is hemodynamically stable for private vehicle transport at this time.  Urine pregnancy was negative, urinalysis without evidence of urinary tract infection but does show trace RBCs, large ketones, glucose.  No known history of blood sugar issues/diabetes per patient and per chart review though very minimal data able to be  reviewed.  Again do feel immediate labs and further evaluation warranted here.  Final Clinical Impressions(s) / UC Diagnoses   Final diagnoses:  Lower abdominal pain  Discharge Instructions      We recommend you go to the emergency department for further evaluation of your severe abdominal pain.    ED Prescriptions   None    PDMP not reviewed this encounter.    [1]  Social History Tobacco Use   Smoking status: Never  Vaping Use   Vaping status: Never Used  Substance Use Topics   Alcohol use: Yes    Comment: occasional   Drug use: No     Stuart Vernell Norris, PA-C 01/02/25 1019  "

## 2025-01-02 NOTE — ED Provider Notes (Signed)
 " [place] [facility] Provider Note   CSN: [id number] Arrival date & time: 01/02/25  9042     Patient presents with: Abdominal Pain   Brianna Costa is a 49 y.o. female.   Patient is a 49 year old female who presents to the emergency department with a chief complaint of bilateral lower quadrant abdominal pain which has been ongoing for approximate the past 5 days.  She does admit to associated intermittent fevers as well.  She does have a history of ovarian cyst.  She is currently on her menstrual cycle at this time.  She denies any associated nausea, vomiting, diarrhea.  She has had no dysuria or hematuria.  She denies any history of abdominal surgeries.   Abdominal Pain      Prior to Admission medications  Medication Sig Start Date End Date Taking? Authorizing Provider  ibuprofen (ADVIL,MOTRIN) 800 MG tablet Take 800 mg by mouth every 8 (eight) hours as needed.      [provider]    Allergies: Iohexol    Review of Systems  Gastrointestinal:  Positive for abdominal pain.  All other systems reviewed and are negative.   Updated Vital Signs BP 122/60 (BP Location: Right Arm)   Pulse 84   Temp 97.8 F (36.6 C) (Temporal)   Resp 18   Ht 5' 3 (1.6 m)   Wt 76.2 kg   LMP 12/28/2024 (Exact Date)   SpO2 96%   BMI 29.76 kg/m   Physical Exam Vitals and nursing note reviewed.  Constitutional:      General: She is not in acute distress.    Appearance: Normal appearance. She is not ill-appearing.  HENT:     Head: Normocephalic and atraumatic.     Nose: Nose normal.     Mouth/Throat:     Mouth: Mucous membranes are moist.  Eyes:     Extraocular Movements: Extraocular movements intact.     Conjunctiva/sclera: Conjunctivae normal.     Pupils: Pupils are equal, round, and reactive to light.  Cardiovascular:     Rate and Rhythm: Normal rate and regular rhythm.     Pulses: Normal pulses.     Heart sounds: Normal heart  sounds. No murmur heard.    No gallop.  Pulmonary:     Effort: Pulmonary effort is normal.     Breath sounds: Normal breath sounds.  Abdominal:     General: Abdomen is flat. Bowel sounds are normal. There is no distension. There are no signs of injury.     Palpations: Abdomen is soft.     Tenderness: There is abdominal tenderness in the right lower quadrant, suprapubic area and left lower quadrant.     Hernia: No hernia is present.  Musculoskeletal:        General: Normal range of motion.     Cervical back: Normal range of motion and neck supple.  Skin:    General: Skin is warm and dry.  Neurological:     General: No focal deficit present.     Mental Status: She is alert and oriented to person, place, and time. Mental status is at baseline.  Psychiatric:        Mood and Affect: Mood normal.        Behavior: Behavior normal.        Thought Content: Thought content normal.        Judgment: Judgment normal.     (all labs ordered are listed, but only abnormal results  are displayed) Labs Reviewed  CBC WITH DIFFERENTIAL/PLATELET - Abnormal; Notable for the following components:      Result Value   RBC 5.13 (*)    MCV 79.7 (*)    All other components within normal limits  HCG, SERUM, QUALITATIVE  COMPREHENSIVE METABOLIC PANEL WITH GFR  LIPASE, BLOOD  URINALYSIS, ROUTINE W REFLEX MICROSCOPIC    EKG: None  Radiology: No results found.   Procedures   Medications Ordered in the ED  ketorolac  (TORADOL ) 15 MG/ML injection 15 mg (has no administration in time range)  morphine  (PF) 4 MG/ML injection 4 mg (has no administration in time range)  ondansetron  (ZOFRAN ) injection 4 mg (has no administration in time range)  sodium chloride  0.9 % bolus 1,000 mL (has no administration in time range)                                    Medical Decision Making Amount and/or Complexity of Data Reviewed Labs: ordered. Radiology: ordered.  Risk Prescription drug management.   This  patient presents to the ED for concern of abdominal pain differential diagnosis includes acute appendicitis, cholecystitis, small bowel obstruction, diverticulitis, pyelonephritis, kidney stone, pancreatitis, mesenteric ischemia, ovarian torsion or cyst, PID, tubo-ovarian abscess, uterine fibroid, uterine mass    Additional history obtained:  Additional history obtained from none External records from outside source obtained and reviewed including none   Lab Tests:  I Ordered, and personally interpreted labs.  The pertinent results include: No leukocytosis, no anemia, hyperglycemia noted, unremarkable electrolytes, normal liver function, unremarkable urinalysis, negative lipase   Imaging Studies ordered:  I ordered imaging studies including CT scan abdomen and pelvis, pelvic ultrasound I independently visualized and interpreted imaging which showed masslike thickening of the endometrium measuring 4.8 cm, left-sided ovarian cyst, no other acute surgical process noted I agree with the radiologist interpretation   Medicines ordered and prescription drug management:  I ordered medication including Toradol , morphine , Zofran , IV fluids, Megace  for abdominal pain, uterine mass Reevaluation of the patient after these medicines showed that the patient improved I have reviewed the patients home medicines and have made adjustments as needed   Problem List / ED Course:  Patient is doing well at this time and pain has greatly improved with treatment in the emergency department.  She is stable for discharge home.  Did discuss CT and ultrasound findings with Dr. 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  He did recommend placing the patient on 120 mg of Megace  daily and prescribing 90 tablets.  He did recommend that patient follow-up in his office within the next few weeks for reevaluation.  He does state that he is concerned the patient will need a  hysterectomy.  Patient's blood work has been unremarkable and urinalysis demonstrated no indication for urinary tract infection.  Did discuss patient's hyperglycemia and we will start her on metformin  at this time.  She notes that she has no known history of diabetes.  Discussed the importance of close follow-up with Dr. Jayne for further workup and evaluation of the endometrial mass as well as the ovarian cyst.  Also discussed the importance of close follow-up with a primary care doctor on an outpatient basis for continued management of her apparent diabetes.  No other acute surgical process was noted on CT scan of the abdomen and pelvis.  Strict return precautions were provided for any  new or worsening symptoms.  Patient voiced understanding to the plan and had no additional question.  All workup, plan was discussed with patient via Spanish translator   Social Determinants of Health:  None        Final diagnoses:  None    ED Discharge Orders     None          Daralene Lonni JONETTA DEVONNA 01/02/25 1544    Melvenia Motto, MD 01/06/25 [phone]  "

## 2025-01-07 LAB — GLUCOSE, POCT (MANUAL RESULT ENTRY): POC Glucose: 300 mg/dL — AB (ref 70–99)

## 2025-01-07 NOTE — Congregational Nurse Program (Signed)
 Pt was followed up with a health interview/assessment upon completing their enrollment into the [facility] Uninsured Program on today (1.19.26) with Maricaremen G (Care Guide of Care [facility]) .     Pt states they last seen a PCP 2016, but did have a recent ER visit on 1.14.26    Chief Reasons Needed for PCP (see visit info section)   Past /Current Medical History (stated by patient) Diabetes Cyst on left ovary Mass in abdominal area/fatty liver   Socio-determinants health needs identified: Denies SDOH needs during visit   PHQ-9= 0 (LOW) Reviewed with pt and she reports no concerns in areas related to emotional or behavioral health/   Denies any suicide or homicide ideations at time of visit   Completed During today's Visit -Conducted health screenings for diabetes and hypertension along with basic vital signs during visit   2nd BP Screening was ABN .  Blood Glucose Screening (ABN)   (See vitals signs section for all screening results completed during this visit)   -Reviewed Get Care Now steps and facilities to utilize when non-emergent vs emergent conditions arise -Educated pt on blood pressure and what abnormal values mean.  Provided a know your numbers blood pressure handout to the pt. -Educated pt on Diabetes and what her abnormal values mean.  Provided a know your numbers blood glucose handout to the pt.  -Educated on importance of self-monitoring her blood glucose and obtaining a glucose monitor -Provided Diabetes Nutrition facts and reviewed the My Plate.  Expressed importance of not eating large portions at one time and avoiding eating 16 tortillas in one day as was reported during visit -Provided information on rethinking the drink as it relates to reducing sugar added drinks and finding substitute such as water,unsweetened drinks, etc. -Assisted pt with following up with seeing if her two (2) additional medications were available at [facility] ( Megace  and Metformin ).   Pharmacy verified that both medications were now available for pickup -Scheduled pt 1st PCP appointment with [facility] for Thursday, 1.22.26 at 9:00am -Continous nurse case management upon completion of first medical appointment and thereafter will be maintained with patient while pt is enrolled into [facility] Connect program

## 2025-01-10 ENCOUNTER — Other Ambulatory Visit (HOSPITAL_COMMUNITY)
Admission: RE | Admit: 2025-01-10 | Discharge: 2025-01-10 | Disposition: A | Discharge status: Home / Self Care | Payer: Self-pay | Source: Ambulatory Visit | Attending: Physician Assistant | Admitting: Physician Assistant

## 2025-01-10 ENCOUNTER — Encounter: Payer: Self-pay | Admitting: Physician Assistant

## 2025-01-10 ENCOUNTER — Ambulatory Visit: Payer: Self-pay | Admitting: Physician Assistant

## 2025-01-10 VITALS — BP 138/78 | HR 91 | Temp 96.2°F | Ht 62.5 in | Wt 167.2 lb

## 2025-01-10 DIAGNOSIS — Z789 Other specified health status: Secondary | ICD-10-CM

## 2025-01-10 DIAGNOSIS — Z23 Encounter for immunization: Secondary | ICD-10-CM

## 2025-01-10 DIAGNOSIS — E119 Type 2 diabetes mellitus without complications: Secondary | ICD-10-CM | POA: Insufficient documentation

## 2025-01-10 DIAGNOSIS — Z1239 Encounter for other screening for malignant neoplasm of breast: Secondary | ICD-10-CM

## 2025-01-10 DIAGNOSIS — Z7689 Persons encountering health services in other specified circumstances: Secondary | ICD-10-CM

## 2025-01-10 DIAGNOSIS — N858 Other specified noninflammatory disorders of uterus: Secondary | ICD-10-CM

## 2025-01-10 LAB — HEMOGLOBIN A1C
Hgb A1c MFr Bld: 13.7 % — ABNORMAL HIGH (ref 4.8–5.6)
Mean Plasma Glucose: 346.49 mg/dL

## 2025-01-10 LAB — LIPID PANEL
Cholesterol: 241 mg/dL — ABNORMAL HIGH (ref 0–200)
HDL: 37 mg/dL — ABNORMAL LOW
LDL Cholesterol: 158 mg/dL — ABNORMAL HIGH (ref 0–99)
Total CHOL/HDL Ratio: 6.6 ratio
Triglycerides: 231 mg/dL — ABNORMAL HIGH
VLDL: 46 mg/dL — ABNORMAL HIGH (ref 0–40)

## 2025-01-10 NOTE — Patient Instructions (Signed)
 MamografaBETHA Do esperar Mammogram: What to Expect Ignacia pao es una radiografa de las [place]. Se hace para detectar cambios que no sean normales. Si es hombre, es posible que necesite este estudio si tiene un bulto o hinchazn en el tejido [place]. Si es nurse, learning disability, es probable que counsellor a leisure centre manager estudio alrededor de los 40 aos. Es posible que deba hacerse una antes si tiene un mayor riesgo de warehouse manager cncer de mama. Este estudio permite buscar cambios provocados por un cncer de mama u otros problemas. Mastitis. Ocurre cuando el tejido mamario se irrita y se hincha. Un absceso. Esto es una zona infectada llena de pus. Una bolsa de lquido llamada quiste. Un crecimiento anormal de clulas que no es cncer. Cambios fibroqusticos. Estos cambios pueden hacer que el tejido mamario se sienta denso, con bultos o irregular debajo de la piel. Informe al mdico: Acerca de cualquier alergia que tenga. Si tiene implantes mamarios. Si ha tenido: [employer]. Una biopsia. En esta prueba, se extrae un pequeo trozo de tejido para analizarlo. Ciruga. Si otra persona de su familia ha tenido chief financial officer. Si est amamantando. Si est embarazada o podra estarlo. Cules son los riesgos? El mdico hablar con usted fortune brands. Pueden incluir: Exponerse a la radiacin. En [employer], los [place] de radiacin son muy bajos. La necesidad de ms pruebas. Los resultados no se leen correctamente. Dificultad para heritage manager de mama si es una mujer con mamas densas. Qu ocurre antes del estudio? Hgase este estudio aproximadamente 1 o 2 semanas despus del perodo menstrual. Es el momento en que las mamas estn menos sensibles. Si va a ir a un mdico nuevo, pida que le enven las imgenes de [address] anteriores a su mdico nuevo. El da del estudio, [address] las [place] y [place]. El da del [place], no use lo  siguiente: Desodorantes. Perfumes. Lociones. Polvos. Qutese las alhajas del cuello. Use prendas que pueda ponerse y sacarse fcilmente. Qu ocurre durante la prueba?  Deber quitarse la ropa de la cintura para arriba. Se colocar una bata. Se pondr de pie delante de la mquina de radiografas. La persona que tourist information centre manager cada mama entre dos placas de plstico o vidrio. Las placas comprimirn las mamas durante unos segundos. Trate de relajarse. Quizs le pidan que contenga la respiracin. La presin no causar ningn dao en las mamas. Puede que no resulte cmodo, pero ser muy breve. Se tomarn radiografas desde diferentes ngulos de cada mama. Estos pasos pueden variar. Pregunte cmo ser. Qu puedo esperar despus del estudio? Un mdico llamado radilogo leer el estudio. Es posible que deba volver a theatre manager partes del estudio si las imgenes no son lo suficientemente claras. Siga estas indicaciones en su casa: Podr volver a sus actividades normales de inmediato. Pregunte cundo estarn hexion specialty chemicals del estudio y qu debe hacer para obtenerlos. Es posible que lawyer o ver a su mdico para recruitment consultant. Esta informacin no tiene theme park manager el consejo del mdico. Asegrese de hacerle al mdico cualquier pregunta que tenga. Document Revised: 07/18/2023 Document Reviewed: 07/18/2023 Elsevier Patient Education  2024 [employer].

## 2025-01-10 NOTE — Progress Notes (Signed)
 "  BP 138/78   Pulse 91   Temp (!) 96.2 F (35.7 C)   Ht 5' 2.5 (1.588 m)   Wt 167 lb 4 oz (75.9 kg)   LMP 12/28/2024 (Exact Date)   SpO2 99%   BMI 30.10 kg/m    Subjective:    Patient ID: Brianna Costa, female    DOB: 1976-09-24, 49 y.o.   MRN: [id number]  HPI: Brianna Costa is a 49 y.o. female presenting on 01/10/2025 for New Patient (Initial Visit) (Previous patient at [facility] last seen there 2014-2016.  Pt is here to establish care - was recently dx with DM which she started Metformin  for on 01/07/25.  Pt has also had constant bleeding since 12-28-24. Pt states she took naproxen  which helped with the pain, and no longer takes naproxen , but is still bleeding)   HPI  Chief Complaint  Patient presents with   New Patient (Initial Visit)    Previous patient at [facility] last seen there 2014-2016.  Pt is here to establish care - was recently dx with DM which she started Metformin  for on 01/07/25.  Pt has also had constant bleeding since 12-28-24. Pt states she took naproxen  which helped with the pain, and no longer takes naproxen , but is still bleeding    Abdominal pain started January 9.  Menses onset January 9 as well.   Menses continue.  Abdomen no longer hurting.   Menses had been normal and regular prior to January 9.     She has 5 children.  All born vaginally.  She is s/p BTL.  She does not work.   She moved here from [place] in 1998.    She thinks her last PAP was in 2015.    She has never had a mammogram  Pt was seen in ER on 01/02/25 and CT and US  were reviewed; shows a mass in the uterus and ovarian cyst.  Pt was given rx megace  for the bleeding.     Relevant past medical, surgical, family and social history reviewed and updated as indicated. Interim medical history since our last visit reviewed. Allergies and medications reviewed and updated.   Current Outpatient Medications:    megestrol  (MEGACE ) 40 MG tablet, Take 3 tablets (120 mg total) by mouth daily.,  Disp: 90 tablet, Rfl: 0   metFORMIN  (GLUCOPHAGE ) 500 MG tablet, Take 1 tablet (500 mg total) by mouth daily with breakfast., Disp: 30 tablet, Rfl: 1   ondansetron  (ZOFRAN -ODT) 4 MG disintegrating tablet, Take 1 tablet (4 mg total) by mouth every 8 (eight) hours as needed for nausea or vomiting. (Patient not taking: Reported on 01/10/2025), Disp: 20 tablet, Rfl: 0    Review of Systems  Per HPI unless specifically indicated above     Objective:    BP 138/78   Pulse 91   Temp (!) 96.2 F (35.7 C)   Ht 5' 2.5 (1.588 m)   Wt 167 lb 4 oz (75.9 kg)   LMP 12/28/2024 (Exact Date)   SpO2 99%   BMI 30.10 kg/m   Wt Readings from Last 3 Encounters:  01/10/25 167 lb 4 oz (75.9 kg)  01/07/25 168 lb (76.2 kg)  01/02/25 168 lb (76.2 kg)    Physical Exam Vitals reviewed.  Constitutional:      General: She is not in acute distress.    Appearance: She is well-developed. She is not toxic-appearing.  HENT:     Head: Normocephalic and atraumatic.  Eyes:  Extraocular Movements: Extraocular movements intact.     Conjunctiva/sclera: Conjunctivae normal.     Pupils: Pupils are equal, round, and reactive to light.  Neck:     Thyroid: No thyromegaly.  Cardiovascular:     Rate and Rhythm: Normal rate and regular rhythm.  Pulmonary:     Effort: Pulmonary effort is normal.     Breath sounds: Normal breath sounds.  Abdominal:     General: Bowel sounds are normal.     Palpations: Abdomen is soft. There is no mass or pulsatile mass.     Tenderness: There is abdominal tenderness in the right lower quadrant. There is no guarding or rebound.  Musculoskeletal:     Cervical back: Neck supple.     Right lower leg: No edema.     Left lower leg: No edema.  Lymphadenopathy:     Cervical: No cervical adenopathy.  Skin:    General: Skin is warm and dry.  Neurological:     Mental Status: She is alert and oriented to person, place, and time.     Motor: No weakness or tremor.     Gait: Gait is intact.  Gait normal.  Psychiatric:        Attention and Perception: Attention normal.        Behavior: Behavior normal. Behavior is cooperative.     Comments: Pleasant and engaged           Assessment & Plan:   Encounter Diagnoses  Name Primary?   Encounter to establish care Yes   Uterine mass    Diabetes mellitus without complication (HCC)    Need for influenza vaccination    Encounter for screening for malignant neoplasm of breast, unspecified screening modality    Not proficient in English language      Uterine mass -referral to gyn  DM -pt was started on metformin  in by ER provider -will refer for DM eye exam -DM foot exam was done and pt was counseled on DM foot care  Labs -Check A1c, microalbumin, lipids.   Cbc and cmp done last week.  HCM -pt was referred for screening mammogram -influenza vaccination given today by LPN  Uninsured -pt was given application for cafa  -pt to RTO in two weeks.  She is to contact office sooner prn   "

## 2025-01-11 LAB — MICROALBUMIN, URINE: Microalb, Ur: 305.3 ug/mL — ABNORMAL HIGH

## 2025-01-18 ENCOUNTER — Encounter: Payer: Self-pay | Admitting: Obstetrics & Gynecology

## 2025-01-18 ENCOUNTER — Ambulatory Visit (INDEPENDENT_AMBULATORY_CARE_PROVIDER_SITE_OTHER): Payer: Self-pay | Admitting: Obstetrics & Gynecology

## 2025-01-18 VITALS — BP 136/84 | Ht 62.0 in | Wt 168.0 lb

## 2025-01-18 DIAGNOSIS — R9389 Abnormal findings on diagnostic imaging of other specified body structures: Secondary | ICD-10-CM

## 2025-01-18 DIAGNOSIS — Z01818 Encounter for other preprocedural examination: Secondary | ICD-10-CM

## 2025-01-18 DIAGNOSIS — N888 Other specified noninflammatory disorders of cervix uteri: Secondary | ICD-10-CM

## 2025-01-18 DIAGNOSIS — Z124 Encounter for screening for malignant neoplasm of cervix: Secondary | ICD-10-CM

## 2025-01-18 DIAGNOSIS — N939 Abnormal uterine and vaginal bleeding, unspecified: Secondary | ICD-10-CM

## 2025-01-18 NOTE — Progress Notes (Signed)
 "  GYN VISIT Patient name: Brianna Costa MRN [id number]  Date of birth: [birth year] Chief Complaint:   endometrial biopsy  History of Present Illness:   Brianna Costa is a 49 y.o.G71P5 female being seen today for ER follow up due to the following concerns:     Normally has a period that will last for about 6 days.  However, in Jan- started a period Jan 9 and has bleeding since then.  Currently on Megace - it has helped with the pain, but has not improved her bleeding.  12/2024: 6.4 x 4.2 x 7.7 cm uterus severe heterogenous thickening of the endometrium showing 4.8 cm.  I dependently had a chance to review the imaging and it appears as though there is a fibroid within the endometrium.  Left ovary with anechoic unilocular 5.9 x 5.4 x 4.3 cm cyst.   Pt primary language- Spanish, but understands English fairly well Spanish interpreter used for today's visit   Patient's last menstrual period was 12/28/2024 (exact date).    Review of Systems:   Pertinent items are noted in HPI Denies fever/chills, dizziness, headaches, visual disturbances, fatigue, shortness of breath, chest pain. Pertinent History Reviewed:   Past Surgical History:  Procedure Laterality Date   ESOPHAGOGASTRODUODENOSCOPY  06/30/2011   Procedure: ESOPHAGOGASTRODUODENOSCOPY (EGD);  Surgeon: Lamar CHRISTELLA Hollingshead, MD;  Location: [facility];  Service: Endoscopy;  Laterality: N/A;   HAND SURGERY Left 2010   None     TUBAL LIGATION      Past Medical History:  Diagnosis Date   Diabetes mellitus without complication (HCC) 01/02/2025   Fatty liver    GERD (gastroesophageal reflux disease)    Reviewed problem list, medications and allergies. Physical Assessment:   Vitals:   01/18/25 1051  BP: 136/84  Weight: 168 lb (76.2 kg)  Height: 5' 2 (1.575 m)  Body mass index is 30.73 kg/m.       Physical Examination:   General appearance: alert, well appearing, and in no distress  Psych: mood appropriate,  normal affect  Skin: warm & dry   Cardiovascular: normal heart rate noted  Respiratory: normal respiratory effort, no distress  Abdomen: soft, non-tender, no rebound or guarding  Pelvic: VULVA: normal appearing vulva with no masses, tenderness or lesions, VAGINA: normal appearing vagina with normal color and discharge, no lesions.  With placement of speculum large irregular appearing mass approximately 4 to 5 cm in size noted with friable appearance.  Pap collected from portion of exposed/friable tissue.  Bimanual exam completed mass appears to be on small stock protruding through the cervical os.  Extremities: no edema   Chaperone: Aleck Blase    Assessment & Plan:  1) Cervical mass with vaginal bleeding- likely uterine fibroid - Discussed findings on exam with patient and anticipate mass is likely uterine fibroid protruding through the cervical os. - Recommendation for removal under anesthesia, hysteroscopy, D&C - Reviewed risk benefit including but not limited to risk of bleeding, infection and potential for further surgical intervention pending pathology report results.  Also discussed potential need for transfusion should significant blood loss be noted. - Discussed hospital expectations and same-day procedure - While patient may continue with Megace , suspect bleeding will not resolve until mass removed - Plan to schedule for February 18 - Occasion list reviewed, patient is not taking GLP-1  No orders of the defined types were placed in this encounter.   Return for TBD surgery Feb 18.   Mckenzy Salazar, DO Attending Obstetrician & Gynecologist, [facility] [name], [facility]    "

## 2025-01-22 LAB — CYTOLOGY - PAP
Adequacy: ABNORMAL
Comment: NEGATIVE

## 2025-01-23 ENCOUNTER — Ambulatory Visit: Payer: Self-pay | Admitting: Obstetrics & Gynecology

## 2025-01-23 ENCOUNTER — Other Ambulatory Visit: Payer: Self-pay | Admitting: Obstetrics & Gynecology

## 2025-01-23 DIAGNOSIS — N888 Other specified noninflammatory disorders of cervix uteri: Secondary | ICD-10-CM

## 2025-01-23 DIAGNOSIS — D250 Submucous leiomyoma of uterus: Secondary | ICD-10-CM

## 2025-01-23 NOTE — Progress Notes (Signed)
 Order for surgery  Brianna Solorzano, DO Attending Obstetrician & Gynecologist, [facility] [name], [name]

## 2025-01-24 ENCOUNTER — Ambulatory Visit: Payer: Self-pay | Admitting: Physician Assistant

## 2025-02-04 ENCOUNTER — Other Ambulatory Visit (HOSPITAL_COMMUNITY): Payer: Self-pay

## 2025-02-06 ENCOUNTER — Ambulatory Visit (HOSPITAL_COMMUNITY): Admit: 2025-02-06 | Payer: Self-pay | Admitting: Obstetrics & Gynecology
